# Patient Record
Sex: Female | Born: 1960 | Race: White | Hispanic: No | Marital: Married | Smoking: Former smoker
Health system: Southern US, Community
[De-identification: ages and names within clinical notes are randomized; demographics above are authoritative.]

## PROBLEM LIST (undated history)

## (undated) DIAGNOSIS — M1990 Unspecified osteoarthritis, unspecified site: Secondary | ICD-10-CM

## (undated) HISTORY — PX: KIDNEY SURGERY: SHX687

---

## 2010-02-05 ENCOUNTER — Ambulatory Visit: Payer: Self-pay | Admitting: Internal Medicine

## 2011-10-20 ENCOUNTER — Ambulatory Visit: Payer: Self-pay

## 2012-02-09 ENCOUNTER — Ambulatory Visit: Payer: Self-pay

## 2016-01-15 ENCOUNTER — Encounter: Payer: Self-pay | Admitting: *Deleted

## 2016-01-15 ENCOUNTER — Ambulatory Visit (INDEPENDENT_AMBULATORY_CARE_PROVIDER_SITE_OTHER): Payer: BLUE CROSS/BLUE SHIELD

## 2016-01-15 ENCOUNTER — Ambulatory Visit
Admission: EM | Admit: 2016-01-15 | Discharge: 2016-01-15 | Disposition: A | Discharge status: Home / Self Care | Payer: BLUE CROSS/BLUE SHIELD | Attending: Family Medicine | Admitting: Family Medicine

## 2016-01-15 DIAGNOSIS — S5011XA Contusion of right forearm, initial encounter: Secondary | ICD-10-CM | POA: Diagnosis not present

## 2016-01-15 DIAGNOSIS — S20211A Contusion of right front wall of thorax, initial encounter: Secondary | ICD-10-CM

## 2016-01-15 DIAGNOSIS — S161XXA Strain of muscle, fascia and tendon at neck level, initial encounter: Secondary | ICD-10-CM | POA: Diagnosis not present

## 2016-01-15 DIAGNOSIS — R072 Precordial pain: Secondary | ICD-10-CM | POA: Diagnosis present

## 2016-01-15 DIAGNOSIS — Z87891 Personal history of nicotine dependence: Secondary | ICD-10-CM | POA: Insufficient documentation

## 2016-01-15 DIAGNOSIS — S50811A Abrasion of right forearm, initial encounter: Secondary | ICD-10-CM

## 2016-01-15 DIAGNOSIS — Z880 Allergy status to penicillin: Secondary | ICD-10-CM | POA: Diagnosis not present

## 2016-01-15 DIAGNOSIS — S29012A Strain of muscle and tendon of back wall of thorax, initial encounter: Secondary | ICD-10-CM | POA: Diagnosis not present

## 2016-01-15 HISTORY — DX: Unspecified osteoarthritis, unspecified site: M19.90

## 2016-01-15 MED ORDER — ACETAMINOPHEN 500 MG PO TABS: 1000.0000 mg | ORAL_TABLET | Freq: Four times a day (QID) | ORAL | Status: AC | PRN

## 2016-01-15 MED ORDER — DICLOFENAC SODIUM 75 MG PO TBEC: 75.0000 mg | DELAYED_RELEASE_TABLET | Freq: Two times a day (BID) | ORAL | Status: AC

## 2016-01-15 NOTE — Discharge Instructions (Signed)
Abrasion An abrasion is a cut or scrape on the outer surface of your skin. An abrasion does not extend through all of the layers of your skin. It is important to care for your abrasion properly to prevent infection. CAUSES Most abrasions are caused by falling on or gliding across the ground or another surface. When your skin rubs on something, the outer and inner layer of skin rubs off.  SYMPTOMS A cut or scrape is the main symptom of this condition. The scrape may be bleeding, or it may appear red or pink. If there was an associated fall, there may be an underlying bruise. DIAGNOSIS An abrasion is diagnosed with a physical exam. TREATMENT Treatment for this condition depends on how large and deep the abrasion is. Usually, your abrasion will be cleaned with water and mild soap. This removes any dirt or debris that may be stuck. An antibiotic ointment may be applied to the abrasion to help prevent infection. A bandage (dressing) may be placed on the abrasion to keep it clean. You may also need a tetanus shot. HOME CARE INSTRUCTIONS Medicines  Take or apply medicines only as directed by your health care provider.  If you were prescribed an antibiotic ointment, finish all of it even if you start to feel better. Wound Care  Clean the wound with mild soap and water 2-3 times per day or as directed by your health care provider. Pat your wound dry with a clean towel. Do not rub it.  There are many different ways to close and cover a wound. Follow instructions from your health care provider about:  Wound care.  Dressing changes and removal.  Check your wound every day for signs of infection. Watch for:  Redness, swelling, or pain.  Fluid, blood, or pus. General Instructions  Keep the dressing dry as directed by your health care provider. Do not take baths, swim, use a hot tub, or do anything that would put your wound underwater until your health care provider approves.  If there is  swelling, raise (elevate) the injured area above the level of your heart while you are sitting or lying down.  Keep all follow-up visits as directed by your health care provider. This is important. SEEK MEDICAL CARE IF:  You received a tetanus shot and you have swelling, severe pain, redness, or bleeding at the injection site.  Your pain is not controlled with medicine.  You have increased redness, swelling, or pain at the site of your wound. SEEK IMMEDIATE MEDICAL CARE IF:  You have a red streak going away from your wound.  You have a fever.  You have fluid, blood, or pus coming from your wound.  You notice a bad smell coming from your wound or your dressing.   This information is not intended to replace advice given to you by your health care provider. Make sure you discuss any questions you have with your health care provider.   Document Released: 09/12/2005 Document Revised: 08/24/2015 Document Reviewed: 12/01/2014 Elsevier Interactive Patient Education 2016 [place].  Chest Contusion A chest contusion is a deep bruise on your chest area. Contusions are the result of an injury that caused bleeding under the skin. A chest contusion may involve bruising of the skin, muscles, or ribs. The contusion may turn blue, purple, or yellow. Minor injuries will give you a painless contusion, but more severe contusions may stay painful and swollen for a few weeks. CAUSES  A contusion is usually caused by a blow,  trauma, or direct force to an area of the body. SYMPTOMS   Swelling and redness of the injured area.  Discoloration of the injured area.  Tenderness and soreness of the injured area.  Pain. DIAGNOSIS  The diagnosis can be made by taking a history and performing a physical exam. An X-ray, CT scan, or MRI may be needed to determine if there were any associated injuries, such as broken bones (fractures) or internal injuries. TREATMENT  Often, the best treatment for a chest  contusion is resting, icing, and applying cold compresses to the injured area. Deep breathing exercises may be recommended to reduce the risk of pneumonia. Over-the-counter medicines may also be recommended for pain control. HOME CARE INSTRUCTIONS   Put ice on the injured area.  Put ice in a plastic bag.  Place a towel between your skin and the bag.  Leave the ice on for 15-20 minutes, 03-04 times a day.  Only take over-the-counter or prescription medicines as directed by your caregiver. Your caregiver may recommend avoiding anti-inflammatory medicines (aspirin, ibuprofen, and naproxen) for 48 hours because these medicines may increase bruising.  Rest the injured area.  Perform deep-breathing exercises as directed by your caregiver.  Stop smoking if you smoke.  Do not lift objects over 5 pounds (2.3 kg) for 3 days or longer if recommended by your caregiver. SEEK IMMEDIATE MEDICAL CARE IF:   You have increased bruising or swelling.  You have pain that is getting worse.  You have difficulty breathing.  You have dizziness, weakness, or fainting.  You have blood in your urine or stool.  You cough up or vomit blood.  Your swelling or pain is not relieved with medicines. MAKE SURE YOU:   Understand these instructions.  Will watch your condition.  Will get help right away if you are not doing well or get worse.   This information is not intended to replace advice given to you by your health care provider. Make sure you discuss any questions you have with your health care provider.   Document Released: 08/28/2001 Document Revised: 08/27/2012 Document Reviewed: 05/26/2012 Elsevier Interactive Patient Education 2016 [employer] Mid-Back Strain With Rehab  A strain is an injury in which a tendon or muscle is torn. The muscles and tendons of the mid-back are vulnerable to strains. However, these muscles and tendons are very strong and require a great force to be injured. The  muscles of the mid-back are responsible for stabilizing the spinal column, as well as spinal twisting (rotation). Strains are classified into three categories. Grade 1 strains cause pain, but the tendon is not lengthened. Grade 2 strains include a lengthened ligament, due to the ligament being stretched or partially ruptured. With grade 2 strains there is still function, although the function may be decreased. Grade 3 strains involve a complete tear of the tendon or muscle, and function is usually impaired. SYMPTOMS   Pain in the middle of the back.  Pain that may affect only one side, and is worse with movement.  Muscle spasms, and often swelling in the back.  Loss of strength of the back muscles.  Crackling sound (crepitation) when the muscles are touched. CAUSES  Mid-back strains occur when a force is placed on the muscles or tendons that is greater than they can handle. Common causes of injury include:  Ongoing overuse of the muscle-tendon units in the middle back, usually from incorrect body posture.  A single violent injury or force applied to the back. RISK  INCREASES WITH:  Sports that involve twisting forces on the spine or a lot of bending at the waist (football, rugby, weightlifting, bowling, golf, tennis, speed skating, racquetball, swimming, running, gymnastics, diving).  Poor strength and flexibility.  Failure to warm up properly before activity.  Family history of low back pain or disk disorders.  Previous back injury or surgery (especially fusion). PREVENTION  Learn and use proper sports technique.  Warm up and stretch properly before activity.  Allow for adequate recovery between workouts.  Maintain physical fitness:  Strength, flexibility, and endurance.  Cardiovascular fitness. PROGNOSIS  If treated properly, mid-back strains usually heal within 6 weeks. RELATED COMPLICATIONS   Frequently recurring symptoms, resulting in a chronic problem. Properly  treating the problem the first time decreases frequency of recurrence.  Chronic inflammation, scarring, and partial muscle-tendon tear.  Delayed healing or resolution of symptoms, especially if activity is resumed too soon.  Prolonged disability. TREATMENT Treatment first involves the use of ice and medicine, to reduce pain and inflammation. As the pain begins to subside, you may begin strengthening and stretching exercises to improve body posture and sport technique. These exercises may be performed at home or with a therapist. Severe injuries may require referral to a therapist for further evaluation and treatment, such as ultrasound. Corticosteroid injections may be given to help reduce inflammation. Biofeedback (watching monitors of your body processes) and psychotherapy may also be prescribed. Prolonged bed rest is felt to do more harm than good. Massage may help break the muscle spasms. Sometimes, an injection of cortisone, with or without local anesthetics, may be given to help relieve the pain and spasms. MEDICATION   If pain medicine is needed, nonsteroidal anti-inflammatory medicines (aspirin and ibuprofen), or other minor pain relievers (acetaminophen), are often advised.  Do not take pain medicine for 7 days before surgery.  Prescription pain relievers may be given, if your caregiver thinks they are needed. Use only as directed and only as much as you need.  Ointments applied to the skin may be helpful.  Corticosteroid injections may be given by your caregiver. These injections should be reserved for the most serious cases, because they may only be given a certain number of times. HEAT AND COLD:   Cold treatment (icing) should be applied for 10 to 15 minutes every 2 to 3 hours for inflammation and pain, and immediately after activity that aggravates your symptoms. Use ice packs or an ice massage.  Heat treatment may be used before performing stretching and strengthening activities  prescribed by your caregiver, physical therapist, or athletic trainer. Use a heat pack or a warm water soak. SEEK IMMEDIATE MEDICAL CARE IF:  Symptoms get worse or do not improve in 2 to 4 weeks, despite treatment.  You develop numbness, weakness, or loss of bowel or bladder function.  New, unexplained symptoms develop. (Drugs used in treatment may produce side effects.) EXERCISES RANGE OF MOTION (ROM) AND STRETCHING EXERCISES - Mid-Back Strain These exercises may help you when beginning to rehabilitate your injury. In order to successfully resolve your symptoms, you must improve your posture. These exercises are designed to help reduce the forward-head and rounded-shoulder posture which contributes to this condition. Your symptoms may resolve with or without further involvement from your physician, physical therapist or athletic trainer. While completing these exercises, remember:   Restoring tissue flexibility helps normal motion to return to the joints. This allows healthier, less painful movement and activity.  An effective stretch should be held for at least 30  seconds.  A stretch should never be painful. You should only feel a gentle lengthening or release in the stretched tissue. STRETCH - Axial Extension  Stand or sit on a firm surface. Assume a good posture: chest up, shoulders drawn back, stomach muscles slightly tense, knees unlocked (if standing) and feet hip width apart.  Slowly retract your chin, so your head slides back and your chin slightly lowers. Continue to look straight ahead.  You should feel a gentle stretch in the back of your head. Be certain not to feel an aggressive stretch since this can cause headaches later.  Hold for __________ seconds. Repeat __________ times. Complete this exercise __________ times per day. RANGE OF MOTION- Upper Thoracic Extension  Sit on a firm chair with a high back. Assume a good posture: chest up, shoulders drawn back, abdominal  muscles slightly tense, and feet hip width apart. Place a small pillow or folded towel in the curve of your lower back, if you are having difficulty maintaining good posture.  Gently brace your neck with your hands, allowing your arms to rest on your chest.  Continue to support your neck and slowly extend your back over the chair. You will feel a stretch across your upper back.  Hold __________ seconds. Slowly return to the starting position. Repeat __________ times. Complete this exercise __________ times per day. RANGE OF MOTION- Mid-Thoracic Extension  Roll a towel so that it is about 4 inches in diameter.  Position the towel lengthwise. Lay on the towel so that your spine, but not your shoulder blades, are supported.  You should feel your mid-back arching toward the floor. To increase the stretch, extend your arms away from your body.  Hold for __________ seconds. Repeat exercise __________ times, __________ times per day. STRENGTHENING EXERCISES - Mid-Back Strain These exercises may help you when beginning to rehabilitate your injury. They may resolve your symptoms with or without further involvement from your physician, physical therapist or athletic trainer. While completing these exercises, remember:   Muscles can gain both the endurance and the strength needed for everyday activities through controlled exercises.  Complete these exercises as instructed by your physician, physical therapist or athletic trainer. Increase the resistance and repetitions only as guided by your caregiver.  You may experience muscle soreness or fatigue, but the pain or discomfort you are trying to eliminate should never worsen during these exercises. If this pain does worsen, stop and make certain you are following the directions exactly. If the pain is still present after adjustments, discontinue the exercise until you can discuss the trouble with your caregiver. STRENGTHENING - Quadruped, Opposite UE/LE  Lift  Assume a hands and knees position on a firm surface. Keep your hands under your shoulders and your knees under your hips. You may place padding under your knees for comfort.  Find your neutral spine and gently tense your abdominal muscles so that you can maintain this position. Your shoulders and hips should form a rectangle that is parallel with the floor and is not twisted.  Keeping your trunk steady, lift your right hand no higher than your shoulder and then your left leg no higher than your hip. Make sure you are not holding your breath. Hold this position __________ seconds.  Continuing to keep your abdominal muscles tense and your back steady, slowly return to your starting position. Repeat with the opposite arm and leg. Repeat __________ times. Complete this exercise __________ times per day.  STRENGTH - Shoulder Extensors  Secure  a rubber exercise band or tubing to a fixed object (table, pole) so that it is at the height of your shoulders when you are either standing, or sitting on a firm armless chair.  With a thumbs-up grip, grasp an end of the band in each hand. Straighten your elbows and lift your hands straight in front of you at shoulder height. Step back away from the secured end of band, until it becomes tense.  Squeezing your shoulder blades together, pull your hands down to the sides of your thighs. Do not allow your hands to go behind you.  Hold for __________ seconds. Slowly ease the tension on the band, as you reverse the directions and return to the starting position. Repeat __________ times. Complete this exercise __________ times per day.  STRENGTH - Horizontal Abductors Choose one of the two positions to complete this exercise. Prone: lying on stomach:  Lie on your stomach on a firm surface so that your right / left arm overhangs the edge. Rest your forehead on your opposite forearm. With your palm facing the floor and your elbow straight, hold a __________  weight in your hand.  Squeeze your right / left shoulder blade to your mid-back spine and then slowly raise your arm to the height of the bed.  Hold for __________ seconds. Slowly reverse the directions and return to the starting position, controlling the weight as you lower your arm. Repeat __________ times. Complete this exercise __________ times per day. Standing:   Secure a rubber exercise band or tubing, so that it is at the height of your shoulders when you are either standing, or sitting on a firm armless chair.  Grasp an end of the band in each hand and have your palms face each other. Straighten your elbows and lift your hands straight in front of you at shoulder height. Step back away from the secured end of band, until it becomes tense.  Squeeze your shoulder blades together. Keeping your elbows locked and your hands at shoulder height, spread your arms apart, forming a "T" shape with your body. Hold __________ seconds. Slowly ease the tension on the band, as you reverse the directions and return to the starting position. Repeat __________ times. Complete this exercise __________ times per day. STRENGTH - Scapular Retractors and External Rotators, Rowing  Secure a rubber exercise band or tubing, so that it is at the height of your shoulders when you are either standing, or sitting on a firm armless chair.  With a palm-down grip, grasp an end of the band in each hand. Straighten your elbows and lift your hands straight in front of you at shoulder height. Step back away from the secured end of band, until it becomes tense.  Step 1: Squeeze your shoulder blades together. Bending your elbows, draw your hands to your chest as if you are rowing a boat. At the end of this motion, your hands and elbow should be at shoulder height and your elbows should be out to your sides.  Step 2: Rotate your shoulder to raise your hands above your head. Your forearms should be vertical and your upper arms  should be horizontal.  Hold for __________ seconds. Slowly ease the tension on the band, as you reverse the directions and return to the starting position. Repeat __________ times. Complete this exercise __________ times per day.  POSTURE AND BODY MECHANICS CONSIDERATIONS - Mid-Back Strain Keeping correct posture when sitting, standing or completing your activities will reduce the stress put  on different body tissues, allowing injured tissues a chance to heal and limiting painful experiences. The following are general guidelines for improved posture. Your physician or physical therapist will provide you with any instructions specific to your needs. While reading these guidelines, remember:  The exercises prescribed by your provider will help you have the flexibility and strength to maintain correct postures.  The correct posture provides the best environment for your joints to work. All of your joints have less wear and tear when properly supported by a spine with good posture. This means you will experience a healthier, less painful body.  Correct posture must be practiced with all of your activities, especially prolonged sitting and standing. Correct posture is as important when doing repetitive low-stress activities (typing) as it is when doing a single heavy-load activity (lifting). PROPER SITTING POSTURE In order to minimize stress and discomfort on your spine, you must sit with correct posture. Sitting with good posture should be effortless for a healthy body. Returning to good posture is a gradual process. Many people can work toward this most comfortably by using various supports until they have the flexibility and strength to maintain this posture on their own. When sitting with proper posture, your ears will fall over your shoulders and your shoulders will fall over your hips. You should use the back of the chair to support your upper back. Your lower back will be in a neutral position, just  slightly arched. You may place a small pillow or folded towel at the base of your low back for  support.  When working at a desk, create an environment that supports good, upright posture. Without extra support, muscles fatigue and lead to excessive strain on joints and other tissues. Keep these recommendations in mind: CHAIR:  A chair should be able to slide under your desk when your back makes contact with the back of the chair. This allows you to work closely.  The chair's height should allow your eyes to be level with the upper part of your monitor and your hands to be slightly lower than your elbows. BODY POSITION  Your feet should make contact with the floor. If this is not possible, use a foot rest.  Keep your ears over your shoulders. This will reduce stress on your neck and lower back. INCORRECT SITTING POSTURES If you are feeling tired and unable to assume a healthy sitting posture, do not slouch or slump. This puts excessive strain on your back tissues, causing more damage and pain. Healthier options include:  Using more support, like a lumbar pillow.  Switching tasks to something that requires you to be upright or walking.  Talking a brief walk.  Lying down to rest in a neutral-spine position. CORRECT STANDING POSTURES Proper standing posture should be assumed with all daily activities, even if they only take a few moments, like when brushing your teeth. As in sitting, your ears should fall over your shoulders and your shoulders should fall over your hips. You should keep a slight tension in your abdominal muscles to brace your spine. Your tailbone should point down to the ground, not behind your body, resulting in an over-extended swayback posture.  INCORRECT STANDING POSTURES Common incorrect standing postures include a forward head, locked knees, and an excessive swayback. WALKING Walk with an upright posture. Your ears, shoulders and hips should all line-up. CORRECT  LIFTING TECHNIQUES DO :   Assume a wide stance. This will provide you more stability and the opportunity to get  as close as possible to the object which you are lifting.  Tense your abdominals to brace your spine. Bend at the knees and hips. Keeping your back locked in a neutral-spine position, lift using your leg muscles. Lift with your legs, keeping your back straight.  Test the weight of unknown objects before attempting to lift them.  Try to keep your elbows locked down at your sides in order get the best strength from your shoulders when carrying an object.  Always ask for help when lifting heavy or awkward objects. INCORRECT LIFTING TECHNIQUES DO NOT:   Lock your knees when lifting, even if it is a small object.  Bend and twist. Pivot at your feet or move your feet when needing to change directions.  Assume that you can safely pick up even a paperclip without proper posture.   This information is not intended to replace advice given to you by your health care provider. Make sure you discuss any questions you have with your health care provider.   Document Released: 12/03/2005 Document Revised: 04/19/2015 Document Reviewed: 03/17/2009 Else[employer] Interactive Patient Education [employer]

## 2016-01-15 NOTE — ED Notes (Signed)
Pt states that she fell off her horse 2 weeks ago, landed on her right side, "crushing" her right shoulder, rib, chest, back area, Pt states that she is still having pain.

## 2016-01-15 NOTE — ED Provider Notes (Signed)
CSN: [id number]     Arrival date & time 01/15/16  1444 History   First MD Initiated Contact with Patient 01/15/16 1529     Chief Complaint  Patient presents with  . 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  Fell off horse trotting (saddle fell off) 2 weeks ago landed on right arm was wearing jacket/sweater thick clothes right elbow with bruising and scratch along right forearm that has resolved.  Arm pain initially gradually improving but central chest pain initially all the time then changed to burning with cough, sneeze, breathing has worsened as noted above.  "I am worried I hurt my heart or lungs"  Patient reported she had cold symptoms around the same time chest symptoms changed also. Requesting EKG and imaging as has to fly again tomorrow.  Tried running today didn't go well.  Started taking her rheumatoid arthritis Voltaren  po BID helping with pain but not resolving it she takes prn for rheumatoid arthritis typically but started after fall off horse.  Having right scapular/mid back pain also with certain movements/deep inspiration.  Right hand dominant  FHx F- MI M-hyperlipidemia  Patient is a 55 y.o. female presenting with fall. The history is provided by the patient.  Fall This is a new problem. The current episode started more than 1 week ago. The problem occurs rarely. The problem has been gradually improving. Associated symptoms include chest pain. Pertinent negatives include no abdominal pain, no headaches and no shortness of breath. The symptoms are aggravated by walking, bending, twisting, exertion and coughing. The symptoms are relieved by medications and rest. She has tried rest, food and water for the symptoms. The treatment provided moderate relief.    Past Medical History  Diagnosis  Date  . Arthritis    Past Surgical History  Procedure Laterality Date  . Cesarean section    . Kidney surgery     History reviewed. No pertinent family history. Social History  Substance Use Topics  . Smoking status: Former Games developer  . Smokeless tobacco: None  . Alcohol Use: Yes   OB History    No data available     Review of Systems  Constitutional: Negative for fever, chills, diaphoresis, activity change, appetite change and fatigue.  HENT: Positive for congestion and rhinorrhea. Negative for dental problem, drooling, ear discharge, ear pain, facial swelling, hearing loss, mouth sores, nosebleeds, sore throat, trouble swallowing and voice change.   Eyes: Negative for photophobia, pain, discharge, redness, itching and visual disturbance.  Respiratory: Positive for cough. Negative for apnea, choking, chest tightness, shortness of breath, wheezing and stridor.   Cardiovascular: Positive for chest pain. Negative for palpitations and leg swelling.  Gastrointestinal: Negative for nausea, vomiting, abdominal pain, diarrhea, constipation, blood in stool, abdominal distention and anal bleeding.  Endocrine: Negative for cold intolerance and heat intolerance.  Genitourinary: Negative for dysuria, hematuria and difficulty urinating.  Musculoskeletal: Positive for myalgias. Negative for back pain, joint swelling, arthralgias, gait problem, neck pain and neck stiffness.  Skin: Positive for rash and wound. Negative for color change and pallor.  Allergic/Immunologic: Negative for environmental allergies and food allergies.  Neurological: Negative for dizziness, tremors, seizures, syncope, facial asymmetry, speech difficulty, weakness, light-headedness, numbness and headaches.  Hematological: Negative for adenopathy. Does not bruise/bleed easily.  Psychiatric/Behavioral: Negative for confusion, sleep disturbance and agitation. The patient is not nervous/anxious.     Allergies  Ciprofloxacin;  Contrast media; Demerol; Penicillins; Shellfish allergy; and Sulfa antibiotics  Home Medications   Prior to Admission medications   Medication Sig Start Date End Date Taking? Authorizing Provider  diclofenac (VOLTAREN) 75 MG EC tablet Take 75 mg by mouth 2 (two) times daily.   Yes Historical Provider, MD  nitrofurantoin, macrocrystal-monohydrate, (MACROBID) 100 MG capsule Take 100 mg by mouth 2 (two) times daily.   Yes Historical Provider, MD  acetaminophen (TYLENOL) 500 MG tablet Take 2 tablets (1,000 mg total) by mouth every 6 (six) hours as needed for mild pain or moderate pain. 01/15/16 01/27/16  Barbaraann Barthel, NP  diclofenac (VOLTAREN) 75 MG EC tablet Take 1 tablet (75 mg total) by mouth 2 (two) times daily. 01/15/16 02/14/16  Barbaraann Barthel, NP   Meds Ordered and Administered this Visit  Medications - No data to display  BP 127/89 mmHg  Pulse 80  Temp(Src) 98.1 F (36.7 C) (Oral)  Ht 5' 11.5" (1.816 m)  Wt 175 lb (79.379 kg)  BMI 24.07 kg/m2  SpO2 100% No data found.   Physical Exam  Constitutional: She is oriented to person, place, and time. She appears well-developed and well-nourished. She is active and cooperative.  Non-toxic appearance. She does not have a sickly appearance. She does not appear ill. No distress.  HENT:  Head: Normocephalic and atraumatic.  Right Ear: Hearing, external ear and ear canal normal. A middle ear effusion is present.  Left Ear: Hearing, external ear and ear canal normal. A middle ear effusion is present.  Nose: Mucosal edema present. No rhinorrhea, nose lacerations, sinus tenderness, nasal deformity, septal deviation or nasal septal hematoma. No epistaxis.  No foreign bodies. Right sinus exhibits no maxillary sinus tenderness and no frontal sinus tenderness. Left sinus exhibits no maxillary sinus tenderness and no frontal sinus tenderness.  Mouth/Throat: Uvula is midline and mucous membranes are normal. Mucous membranes are not pale, not dry  and not cyanotic. She does not have dentures. No oral lesions. No trismus in the jaw. Normal dentition. No dental abscesses, uvula swelling, lacerations or dental caries. Posterior oropharyngeal edema and posterior oropharyngeal erythema present. No oropharyngeal exudate or tonsillar abscesses.  Cobblestoning posterior pharynx; bilateral TMs with air fluid level clear  Eyes: Conjunctivae, EOM and lids are normal. Pupils are equal, round, and reactive to light. Right eye exhibits no chemosis, no discharge, no exudate and no hordeolum. No foreign body present in the right eye. Left eye exhibits no chemosis, no discharge, no exudate and no hordeolum. No foreign body present in the left eye. Right conjunctiva is not injected. Right conjunctiva has no hemorrhage. Left conjunctiva is not injected. Left conjunctiva has no hemorrhage. No scleral icterus. Right eye exhibits normal extraocular motion and no nystagmus. Left eye exhibits normal extraocular motion and no nystagmus. Right pupil is round and reactive. Left pupil is round and reactive. Pupils are equal.  Neck: Trachea normal and normal range of motion. Neck supple. No tracheal tenderness, no spinous process tenderness and no muscular tenderness present. No rigidity. No tracheal deviation, no edema, no erythema and normal range of motion present. No thyroid mass and no thyromegaly present.  Cardiovascular: Regular rhythm, S1 normal, S2 normal, normal heart sounds and intact distal pulses.  Bradycardia present.  PMI is not displaced.  Exam reveals no gallop and no friction rub.   No murmur heard.   Pulmonary/Chest: Effort normal and breath sounds normal. No accessory muscle usage or stridor. No respiratory distress. She has no decreased  breath sounds. She has no wheezes. She has no rhonchi. She has no rales. She exhibits no tenderness.  Abdominal: Soft. She exhibits no distension.  Musculoskeletal: Normal range of motion.       Right shoulder: She exhibits  pain.       Left shoulder: Normal.       Right elbow: Normal.      Left elbow: Normal.       Right wrist: Normal.       Left wrist: Normal.       Right hip: Normal.       Left hip: Normal.       Right knee: Normal.       Left knee: Normal.       Right ankle: Normal.       Left ankle: Normal.       Cervical back: Normal.       Thoracic back: She exhibits tenderness and pain. She exhibits normal range of motion, no bony tenderness, no swelling, no edema, no deformity, no laceration, no spasm and normal pulse.       Lumbar back: She exhibits pain. She exhibits normal range of motion, no tenderness, no bony tenderness, no swelling, no edema, no deformity, no laceration, no spasm and normal pulse.       Back:       Right upper arm: Normal.       Left upper arm: Normal.       Right forearm: Normal.       Left forearm: Normal.       Arms:      Right hand: Normal.       Left hand: Normal.  Pain with empty can but scapular only right; external rotation right shoulder pain right scapular ridge; crossbody reach pain right scapula - atchley scratch/internal rotation; patient sitting in chair working on laptop wearing exercise attire easily moves from chair to exam table to perform AROM exercises for PE no grimacing or holding breath  Lymphadenopathy:       Head (right side): No submental, no submandibular, no tonsillar, no preauricular, no posterior auricular and no occipital adenopathy present.       Head (left side): No submental, no submandibular, no tonsillar, no preauricular, no posterior auricular and no occipital adenopathy present.    She has no cervical adenopathy.       Right cervical: No superficial cervical, no deep cervical and no posterior cervical adenopathy present.      Left cervical: No superficial cervical, no deep cervical and no posterior cervical adenopathy present.  Neurological: She is alert and oriented to person, place, and time. She has normal strength. She is not  disoriented. She displays no atrophy and no tremor. No cranial nerve deficit or sensory deficit. She exhibits normal muscle tone. She displays no seizure activity. Coordination and gait normal. GCS eye subscore is 4. GCS verbal subscore is 5. GCS motor subscore is 6.  Reflex Scores:      Patellar reflexes are 2+ on the right side and 2+ on the left side. Bilateral hand grasp equal arom 5/5;   Skin: Skin is warm, dry and intact. No abrasion, no bruising, no burn, no ecchymosis, no laceration, no lesion, no petechiae and no rash noted. She is not diaphoretic. No cyanosis or erythema. No pallor. Nails show no clubbing.  Psychiatric: She has a normal mood and affect. Her speech is normal and behavior is normal. Judgment and thought content normal. Cognition and memory  are normal.  Nursing note and vitals reviewed.   ED Course  ED EKG  Date/Time: 01/15/2016 4:16 PM Performed by: Albina Billet A Authorized by: Albina Billet A Comparison: not compared with previous ECG  Previous ECG: no previous ECG available Rhythm: sinus bradycardia Rate: bradycardic BPM: 55 QRS axis: normal Conduction: conduction normal ST Segments: ST segments normal T Waves: T waves normal Other: no other findings Clinical impression: normal ECG Comments: Vent rate 55bpm PR interval QRS duration 86ms QT/QTc 450/455ms PRT axes 43 44 49   (including critical care time)  Labs Review Labs Reviewed - No data to display  Imaging Review Dg Chest 2 View  01/15/2016  CLINICAL DATA:  Larey Seat off porch 2 weeks ago.  Midsternal chest pain. EXAM: CHEST  2 VIEW COMPARISON:  02/09/2012 FINDINGS: The heart size and mediastinal contours are within normal limits. Both lungs are clear. No evidence of pneumothorax or hemothorax. The visualized skeletal structures are unremarkable. IMPRESSION: No active cardiopulmonary disease. Electronically Signed   By: Myles Rosenthal M.D.   On: 01/15/2016 16:30     1615 Reviewed [place]  Controlled Substances Website Last Name:  [facility]:  First Name:  Ortencia  Zip Code:  Date of Birth:  09/23/1961  Dispensed Start Date:  12/17/2014 Dispensed End Date:  01/15/2016  Recipients:         Dispensed From 12/17/2014 to 01/15/2016 4 out of 4 Recipient(s) Selected [place], Mackynzie - DOB: [birth year] - [address] [place], [place] - DOB: [birth year] - [address] [place], Milisa - DOB: Sep 08, 1961 - [address] [place], [place] - DOB: 15-Jul-1961 - [address] " Date Dispensed/ Date Prescribed Drug Name/ NDC Qty. Dispensed/ Days Supply Refill #/ Authorized Refills Gaffer Recipient *Pmt. Method MED Daily  07/07/2015 07/07/2015 CLONAZEPAM 0.5 MG TABLET [id number] 45 45 0 0 [id number] SOTOLONGO CARLOS A MD [place], [place] [id number] [facility] [place], [place] Despain, Lynee 1961-09-03 [address] [place], [place] [address] 04 0  01/31/2015 01/31/2015 OXYCODONE- ACETAMINOPHEN 5- 325 [id number] 12 3 0 0 [id number] LEVIN GIBSON LINDA DDS [place], [place] [id number] [facility] [place], [place] Wendt, Priscillia 1961/06/28 [address] [place], [place] [address] 04 30  01/04/2015 01/04/2015 CLONAZEPAM 0.5 MG TABLET [id number] 45 45 0 0 [id number] HAYWARD MARGARET K (MSN FNP-BC) [place], [place] [id number] [facility] [place], [place] Trani, [place] 02-22-1961 [address] [place], [place] [address] 04 0   1620 EKG sinus bradycardia patient notified normal no acute MI noted  Given copy of ekg report. Chest xray pending patient verbalized understanding of information and had no further questions at this time.  1650 discussed normal chest xray results with patient probable contusion/muscle strain intercostal. Given copy of radiology report. Continue voltaren and or tylenol.  Follow up with PCM 2 weeks if not gradually improving sooner if worsening chest pain, sob, wheezing.  Patient verbalized understanding of  information/instructions, agreed with plan of care and had no further questions at this time.   MDM   1. Fall from horse, initial encounter   2. Chest wall contusion, right, initial encounter   3. Forearm contusion, right, initial encounter   4. Abrasion of forearm, right, initial encounter   5. Strain of mid-back, initial encounter    Patient was instructed to rest, ice, and ROM exercises. May continue voltaren 75mg  po BID refilled 30 day supply or tylenol 1000mg  po QID prn pain.  Ice or heat prn.  Exitcare  handout on mid back strain with rehab exercises, contusion, muscle strain given to patient.  Activity as tolerated start with low impact no weights and work back up to running.  If not improving consider MRI/follow up with PCM/orthopedics of choice/formal PT.  Patient verbalized agreement and understanding of treatment plan and had no further questions at this time.  P2:  Injury Prevention and Fitness.  For acute pain, rest, and intermittent application of heat (do not sleep on heating pad).  I discussed longer term treatment plan of PRN NSAIDS and I discussed a home back care exercise program with a flexion exercise routine.  Proper avoidance of heavy lifting discussed.  Consider physical therapy and additional radiology (MRI) if not improving.  Call [facility] or return to clinic as needed if these symptoms worsen or fail to improve as anticipated.   Patient verbalized agreement and understanding of treatment plan and had no further questions at this time. P2:  Injury Prevention, fitness  Patient was instructed to rest, and ice right forearm/ribs.  Do not soak arm until abrasions completely healed avoid pool, lake, hot tub, dirty sink water.  Exitcare handout on contusion and abrasion given to patient.   Medications as directed.  Call or follow up with PCM/orthopedics of choice as needed if these symptoms worsen or fail to improve as anticipated consider MRI/formal PT/orthopedics evaluation.  Patient  verbalized agreement and understanding of treatment plan and had no further questions at this time.  P2:  ROM, injury prevention   Barbaraann Barthel, NP 01/15/16 1704  Barbaraann Barthel, NP 01/15/16 1706

## 2016-03-25 ENCOUNTER — Encounter: Payer: Self-pay | Admitting: Gynecology

## 2016-03-25 ENCOUNTER — Ambulatory Visit
Admission: EM | Admit: 2016-03-25 | Discharge: 2016-03-25 | Disposition: A | Discharge status: Home / Self Care | Payer: BLUE CROSS/BLUE SHIELD | Attending: Family Medicine | Admitting: Family Medicine

## 2016-03-25 DIAGNOSIS — B349 Viral infection, unspecified: Secondary | ICD-10-CM | POA: Diagnosis not present

## 2016-03-25 DIAGNOSIS — J988 Other specified respiratory disorders: Secondary | ICD-10-CM

## 2016-03-25 DIAGNOSIS — B9789 Other viral agents as the cause of diseases classified elsewhere: Secondary | ICD-10-CM

## 2016-03-25 LAB — RAPID STREP SCREEN (MED CTR MEBANE ONLY): Streptococcus, Group A Screen (Direct): NEGATIVE

## 2016-03-25 LAB — RAPID INFLUENZA A&B ANTIGENS
Influenza A (ARMC): NEGATIVE
Influenza B (ARMC): NEGATIVE

## 2016-03-25 MED ORDER — ALBUTEROL SULFATE HFA 108 (90 BASE) MCG/ACT IN AERS: 2.0000 | INHALATION_SPRAY | RESPIRATORY_TRACT | Status: AC | PRN

## 2016-03-25 MED ORDER — BENZONATATE 100 MG PO CAPS: 100.0000 mg | ORAL_CAPSULE | Freq: Three times a day (TID) | ORAL | Status: DC | PRN

## 2016-03-25 MED ORDER — GUAIFENESIN-CODEINE 100-10 MG/5ML PO SOLN: 10.0000 mL | Freq: Every evening | ORAL | Status: DC | PRN

## 2016-03-25 NOTE — Discharge Instructions (Signed)
Take medication as prescribed. Rest. Drink plenty of fluids. Take over the counter tylenol or ibuprofen as needed for pain or fever.  ° °Follow up with your primary care physician this week as needed. Return to Urgent care for new or worsening concerns.  ° °Viral Infections °A viral infection can be caused by different types of viruses. Most viral infections are not serious and resolve on their own. However, some infections may cause severe symptoms and may lead to further complications. °SYMPTOMS °Viruses can frequently cause: °· Minor sore throat. °· Aches and pains. °· Headaches. °· Runny nose. °· Different types of rashes. °· Watery eyes. °· Tiredness. °· Cough. °· Loss of appetite. °· Gastrointestinal infections, resulting in nausea, vomiting, and diarrhea. °These symptoms do not respond to antibiotics because the infection is not caused by bacteria. However, you might catch a bacterial infection following the viral infection. This is sometimes called a "superinfection." Symptoms of such a bacterial infection may include: °· Worsening sore throat with pus and difficulty swallowing. °· Swollen neck glands. °· Chills and a high or persistent fever. °· Severe headache. °· Tenderness over the sinuses. °· Persistent overall ill feeling (malaise), muscle aches, and tiredness (fatigue). °· Persistent cough. °· Yellow, green, or brown mucus production with coughing. °HOME CARE INSTRUCTIONS  °· Only take over-the-counter or prescription medicines for pain, discomfort, diarrhea, or fever as directed by your caregiver. °· Drink enough water and fluids to keep your urine clear or pale yellow. Sports drinks can provide valuable electrolytes, sugars, and hydration. °· Get plenty of rest and maintain proper nutrition. Soups and broths with crackers or rice are fine. °SEEK IMMEDIATE MEDICAL CARE IF:  °· You have severe headaches, shortness of breath, chest pain, neck pain, or an unusual rash. °· You have uncontrolled vomiting,  diarrhea, or you are unable to keep down fluids. °· You or your child has an oral temperature above 102° F (38.9° C), not controlled by medicine. °· Your baby is older than 3 months with a rectal temperature of 102° F (38.9° C) or higher. °· Your baby is 3 months old or younger with a rectal temperature of 100.4° F (38° C) or higher. °MAKE SURE YOU:  °· Understand these instructions. °· Will watch your condition. °· Will get help right away if you are not doing well or get worse. °  °This information is not intended to replace advice given to you by your health care provider. Make sure you discuss any questions you have with your health care provider. °  °Document Released: 09/12/2005 Document Revised: 02/25/2012 Document Reviewed: 05/11/2015 °Elsevier Interactive Patient Education ©2016 [employer] ° °

## 2016-03-25 NOTE — ED Provider Notes (Signed)
[facility]  ____________________________________________  Time seen: Approximately 11:51 AM  I have reviewed the triage vital signs and the nursing notes.   HISTORY  Chief Complaint Fever; Cough; and Generalized Body Aches  HPI Brooke Cross is a 55 y.o. female presents for complaints of cough, congestion and body aches x 2 days. States symptom onset Friday night. Reports fever 101 degrees orally yesterday at home. Reports took advil yesterday, none today. Reports continues to drink plenty of fluids and eat well. Denies home sick contacts. Reports frequently come in contact with sick contacts when traveling for work. Patient requests a work note.  Patient also reports that she has borderline asthma and states that she usually has a home albuterol inhaler. Patient states that her home albuterol inhaler has ran out and request a prescription for another.  Denies chest pain, shortness of breath, abdominal pain, dysuria, neck pain, back pain, extremity pain, extremity swelling or rash.  No LMP recorded. Patient is postmenopausal.  PCP: [facility]    Past Medical History  Diagnosis Date  . Arthritis   Borderline asthma per patient  There are no active problems to display for this patient.   Past Surgical History  Procedure Laterality Date  . Cesarean section    . Kidney surgery      Current Outpatient Rx  Name  Route  Sig  Dispense  Refill  . albuterol (PROVENTIL HFA;VENTOLIN HFA) 108 (90 Base) MCG/ACT inhaler   Inhalation   Inhale into the lungs every 6 (six) hours as needed for wheezing or shortness of breath.         . diclofenac (VOLTAREN) 75 MG EC tablet   Oral   Take 75 mg by mouth 2 (two) times daily.                    Marland Kitchen. albuterol (PROVENTIL) (2.5 MG/3ML) 0.083% nebulizer solution   Nebulization   Take 2.5 mg by nebulization every 6 (six) hours as needed for wheezing or shortness of breath.           Allergies Ciprofloxacin; Contrast  media; Demerol; Penicillins; Shellfish allergy; and Sulfa antibiotics  No family history on file.  Social History Social History  Substance Use Topics  . Smoking status: Former Games developermoker  . Smokeless tobacco: None  . Alcohol Use: Yes    Review of Systems Constitutional: As above Eyes: No visual changes. ENT: Positive sore throat, nasal congestion, cough. Cardiovascular: Denies chest pain. Respiratory: Denies shortness of breath. Gastrointestinal: No abdominal pain.  No nausea, no vomiting.  No diarrhea.  No constipation. Genitourinary: Negative for dysuria. Musculoskeletal: Negative for back pain. Skin: Negative for rash. Neurological: Negative for headaches, focal weakness or numbness.  10-point ROS otherwise negative.  ____________________________________________   PHYSICAL EXAM:  VITAL SIGNS: ED Triage Vitals  Enc Vitals Group     BP 03/25/16 1055 131/80 mmHg     Pulse Rate 03/25/16 1055 65     Resp 03/25/16 1055 16     Temp 03/25/16 1055 99 F (37.2 C)     Temp Source 03/25/16 1055 Oral     SpO2 03/25/16 1055 100 %     Weight 03/25/16 1055 180 lb (81.647 kg)     Height 03/25/16 1055 6' (1.829 m)     Head Cir --      Peak Flow --      Pain Score 03/25/16 1105 5     Pain Loc --  Pain Edu? --      Excl. in GC? --   Constitutional: Alert and oriented. Well appearing and in no acute distress. Eyes: Conjunctivae are normal. PERRL. EOMI. Head: Atraumatic. No sinus tenderness to palpation. No swelling. No erythema.  Ears: no erythema, normal TMs bilaterally.   Nose:Nasal congestion with clear rhinorrhea  Mouth/Throat: Mucous membranes are moist. Mild pharyngeal erythema. No tonsillar swelling or exudate.  Neck: No stridor.  No cervical spine tenderness to palpation. Hematological/Lymphatic/Immunilogical: No cervical lymphadenopathy. Cardiovascular: Normal rate, regular rhythm. Grossly normal heart sounds.  Good peripheral circulation. Respiratory: Normal  respiratory effort.  No retractions. Lungs CTAB.No wheezes, rales or rhonchi. Good air movement.  Gastrointestinal: Soft and nontender. Normal Bowel sounds. No CVA tenderness. Musculoskeletal: No lower or upper extremity tenderness nor edema. No cervical, thoracic or lumbar tenderness to palpation. Neurologic:  Normal speech and language. No gross focal neurologic deficits are appreciated. No gait instability. Skin:  Skin is warm, dry and intact. No rash noted. Psychiatric: Mood and affect are normal. Speech and behavior are normal.  ____________________________________________   LABS (all labs ordered are listed, but only abnormal results are displayed)  Labs Reviewed  RAPID STREP SCREEN (NOT AT [facility])  RAPID INFLUENZA A&B ANTIGENS ([facility] ONLY)  CULTURE, GROUP A STREP [facility])     INITIAL IMPRESSION / ASSESSMENT AND PLAN / ED COURSE  Pertinent labs & imaging results that were available during my care of the patient were reviewed by me and considered in my medical decision making (see chart for details).  Very well-appearing patient. No acute distress. Presents for the complaints of 3 days of runny nose, nasal congestion, sore throat, cough and reported fever and bodyaches. Lungs clear throughout. Abdomen soft and nontender. Moist mucous membranes. Suspect viral upper respiratory infection. Will treat supportively and symptomatically. Will give Rx for albuterol inhaler. Will treat supportively with when necessary Tessalon Perles and when necessary guaifenesin with codeine as patient reports she has tolerated this all the past.Discussed indication, risks and benefits of medications with patient. Encouraged rest, fluids and PCP follow-up.  Discussed follow up with Primary care physician this week. Discussed follow up and return parameters including no resolution or any worsening concerns. Patient verbalized understanding and agreed to plan.    ____________________________________________   FINAL CLINICAL IMPRESSION(S) / ED DIAGNOSES  Final diagnoses:  Viral respiratory illness      Note: This dictation was prepared with Dragon dictation along with smaller phrase technology. Any transcriptional errors that result from this process are unintentional.    Renford Dills, NP 03/27/16 [phone]

## 2016-03-25 NOTE — ED Notes (Addendum)
Patient c/o does a lot of travel for work. Pt. stated x 3 days fever of 101 / cough / sore throat / and body ache. Per pt. Would like a note for work.

## 2016-03-27 LAB — CULTURE, GROUP A STREP (THRC)

## 2021-08-06 ENCOUNTER — Encounter: Payer: Self-pay | Admitting: Emergency Medicine

## 2021-08-06 ENCOUNTER — Ambulatory Visit (INDEPENDENT_AMBULATORY_CARE_PROVIDER_SITE_OTHER): Payer: BC Managed Care – PPO

## 2021-08-06 ENCOUNTER — Other Ambulatory Visit: Payer: Self-pay

## 2021-08-06 ENCOUNTER — Ambulatory Visit
Admission: EM | Admit: 2021-08-06 | Discharge: 2021-08-06 | Disposition: A | Discharge status: Home / Self Care | Payer: BC Managed Care – PPO | Attending: Internal Medicine | Admitting: Internal Medicine

## 2021-08-06 DIAGNOSIS — U071 COVID-19: Secondary | ICD-10-CM | POA: Diagnosis not present

## 2021-08-06 DIAGNOSIS — R059 Cough, unspecified: Secondary | ICD-10-CM | POA: Diagnosis not present

## 2021-08-06 MED ORDER — MOLNUPIRAVIR EUA 200MG CAPSULE: 4.0000 | ORAL_CAPSULE | Freq: Two times a day (BID) | ORAL | 0 refills | Status: AC

## 2021-08-06 MED ORDER — LEVOFLOXACIN 500 MG PO TABS: 500.0000 mg | ORAL_TABLET | Freq: Every day | ORAL | 0 refills | Status: AC

## 2021-08-06 MED ORDER — PREDNISONE 50 MG PO TABS: ORAL_TABLET | ORAL | 0 refills | Status: AC

## 2021-08-06 MED ORDER — HYDROCODONE BIT-HOMATROP MBR 5-1.5 MG/5ML PO SOLN: 5.0000 mL | Freq: Every evening | ORAL | 0 refills | Status: DC | PRN

## 2021-08-06 MED ORDER — HYDROCODONE BIT-HOMATROP MBR 5-1.5 MG/5ML PO SOLN: 5.0000 mL | Freq: Every evening | ORAL | 0 refills | Status: AC | PRN

## 2021-08-06 NOTE — ED Triage Notes (Signed)
Patient c/o diarrhea, fever, cough, headache, and nasal congestion that started yesterday.  Patient took home covid test today and was positive.  Patient states her cough has been going on for 6 weeks.

## 2021-08-06 NOTE — Discharge Instructions (Addendum)
You need to stay quarantined for 5 days from onset of symptoms, and then wear a mask the other 5 days in public or around family. You may take the following supplements to help your immune system be stronger to fight this viral infection Zinc 50 mg which helps decrease the virus load in your body. Take Melatonin 6-10 mg at bed time which also helps support your immune system.  Also make sure to take Vit D 5,000 IU per day with a fatty meal and Vit C 5000 mg a day until you are completely better. To prevent viral illnesses your vitamin D should be between 60-80. Stay on Vitamin D 2,000  and C  1000 mg the rest of the season.  Don't lay around, keep active and walk as much as you are able to to prevent worsening of your symptoms.  Follow up with your family Dr next week.  If you get short of breath and you are able to check  your oxygen with a pulse oxygen meter, if it gets to 92% or less, you need to go to the hospital to be admitted. If you dont have one, come back here and we will assess you.

## 2021-08-06 NOTE — ED Provider Notes (Signed)
[facility]    CSN: [id number] Arrival date & time: 08/06/21  1215      History   Chief Complaint Chief Complaint  Patient presents with   Cough   Fever    COVID+    HPI Brooke Cross is a 60 y.o. female who presents wit onset cough x 6 weeks, but got worse yesterday with HA, nose congestion, fever, diarrhea. Did a covid test on herself yesterday and was positive. Has never had covid. Has had 4 covid injections. She was seen on 8/8 by her PCP and was placed on Zpack which helped a little, but when done felt her cough got wore. The cough is mostly non productive an on occasion may have some production of yellow mucous.     Past Medical History:  Diagnosis Date   Arthritis     There are no problems to display for this patient.   Past Surgical History:  Procedure Laterality Date   CESAREAN SECTION     KIDNEY SURGERY      OB History   No obstetric history on file.      Home Medications    Prior to Admission medications   Medication Sig Start Date End Date Taking? Authorizing Provider  albuterol (PROVENTIL HFA;VENTOLIN HFA) 108 (90 Base) MCG/ACT inhaler Inhale 2 puffs into the lungs every 4 (four) hours as needed. 03/25/16  Yes Renford Dills, NP  levofloxacin (LEVAQUIN) 500 MG tablet Take 1 tablet (500 mg total) by mouth daily. 08/06/21  Yes Rodriguez-Southworth, Nettie Elm, PA-C  molnupiravir EUA 200 mg CAPS Take 4 capsules (800 mg total) by mouth 2 (two) times daily for 5 days. 08/06/21 08/11/21 Yes Rodriguez-Southworth, Nettie Elm, PA-C  predniSONE (DELTASONE) 50 MG tablet One qd x 5 days 08/06/21  Yes Rodriguez-Southworth, Nettie Elm, PA-C  diclofenac (VOLTAREN) 75 MG EC tablet Take 75 mg by mouth 2 (two) times daily.    [provider]  HYDROcodone bit-homatropine (HYCODAN) 5-1.5 MG/5ML syrup Take 5 mLs by mouth at bedtime as needed for cough. 08/06/21   Rodriguez-Southworth, Nettie Elm, PA-C    Family History History reviewed. No pertinent family  history.  Social History Social History   Tobacco Use   Smoking status: Former   Smokeless tobacco: Never  Building services engineer Use: Never used  Substance Use Topics   Alcohol use: Yes   Drug use: No     Allergies   Ciprofloxacin, Contrast media [iodinated diagnostic agents], Demerol [meperidine], Penicillins, Shellfish allergy, and Sulfa antibiotics   Review of Systems Review of Systems + cough, SOB, wheezing, fever of 100, nose congestion, HA, body aches, fatigue. The rest is neg.   Physical Exam Triage Vital Signs ED Triage Vitals  Enc Vitals Group     BP 08/06/21 1314 (!) 150/90     Pulse Rate 08/06/21 1314 75     Resp 08/06/21 1314 14     Temp 08/06/21 1314 99.5 F (37.5 C)     Temp Source 08/06/21 1314 Oral     SpO2 08/06/21 1314 100 %     Weight 08/06/21 1313 174 lb (78.9 kg)     Height 08/06/21 1313 5' 11.5" (1.816 m)     Head Circumference --      Peak Flow --      Pain Score 08/06/21 1312 0     Pain Loc --      Pain Edu? --      Excl. in GC? --    No data found.  Updated Vital Signs BP (!) 150/90 (BP Location: Left Arm)   Pulse 75   Temp 99.5 F (37.5 C) (Oral)   Resp 14   Ht 5' 11.5" (1.816 m)   Wt 174 lb (78.9 kg)   SpO2 100%   BMI 23.93 kg/m   Visual Acuity Right Eye Distance:   Left Eye Distance:   Bilateral Distance:    Right Eye Near:   Left Eye Near:    Bilateral Near:     Physical Exam Physical Exam Vitals signs and nursing note reviewed.  Constitutional:      General: She is not in acute distress.    Appearance: Normal appearance. She is not ill-appearing, toxic-appearing or diaphoretic.  HENT:     Head: Normocephalic.     Right Ear: Tympanic membrane, ear canal and external ear normal.     Left Ear: Tympanic membrane, ear canal and external ear normal.     Nose: Nose normal.     Mouth/Throat:     Mouth: Mucous membranes are moist.  Eyes:     General: No scleral icterus.       Right eye: No discharge.        Left  eye: No discharge.     Conjunctiva/sclera: Conjunctivae normal.  Neck:     Musculoskeletal: Neck supple. No neck rigidity.  Cardiovascular:     Rate and Rhythm: Normal rate and regular rhythm.     Heart sounds: No murmur.  Pulmonary:     Effort: Pulmonary effort is normal.     Breath sounds: with mild wheezing on R upper lung. The rest is clear  Musculoskeletal: Normal range of motion.  Lymphadenopathy:     Cervical: No cervical adenopathy.  Skin:    General: Skin is warm and dry.     Coloration: Skin is not jaundiced.     Findings: No rash.  Neurological:     Mental Status: She is alert and oriented to person, place, and time.     Gait: Gait normal.  Psychiatric:        Mood and Affect: Mood normal.        Behavior: Behavior normal.        Thought Content: Thought content normal.        Judgment: Judgment normal.    UC Treatments / Results  Labs (all labs ordered are listed, but only abnormal results are displayed) Labs Reviewed - No data to display  EKG   Radiology DG Chest 2 View  Result Date: 08/06/2021 CLINICAL DATA:  Cough for 6 weeks. EXAM: CHEST - 2 VIEW COMPARISON:  01/15/2016 FINDINGS: The heart size and mediastinal contours are within normal limits. Both lungs are clear. The visualized skeletal structures are unremarkable. IMPRESSION: No active cardiopulmonary disease. Electronically Signed   By: Danae Orleans M.D.   On: 08/06/2021 13:37    Procedures Procedures (including critical care time)  Medications Ordered in UC Medications - No data to display  Initial Impression / Assessment and Plan / UC Course  I have reviewed the triage vital signs and the nursing notes. Pertinent  imaging results that were available during my care of the patient were reviewed by me and considered in my medical decision making (see chart for details). Has covid infection and possibly bacterial bronchitis.  I placed her on Levaquin since she tolerates this fine, Prednisone,  Molnupiravir, and hycodan syrup as noted. See instructions.     Final Clinical Impressions(s) / UC Diagnoses   Final  diagnoses:  COVID-19 virus infection     Discharge Instructions      You need to stay quarantined for 5 days from onset of symptoms, and then wear a mask the other 5 days in public or around family. You may take the following supplements to help your immune system be stronger to fight this viral infection Zinc 50 mg which helps decrease the virus load in your body. Take Melatonin 6-10 mg at bed time which also helps support your immune system.  Also make sure to take Vit D 5,000 IU per day with a fatty meal and Vit C 5000 mg a day until you are completely better. To prevent viral illnesses your vitamin D should be between 60-80. Stay on Vitamin D 2,000  and C  1000 mg the rest of the season.  Don't lay around, keep active and walk as much as you are able to to prevent worsening of your symptoms.  Follow up with your family Dr next week.  If you get short of breath and you are able to check  your oxygen with a pulse oxygen meter, if it gets to 92% or less, you need to go to the hospital to be admitted. If you dont have one, come back here and we will assess you.       ED Prescriptions     Medication Sig Dispense Auth. Provider   levofloxacin (LEVAQUIN) 500 MG tablet Take 1 tablet (500 mg total) by mouth daily. 7 tablet Rodriguez-Southworth, Nettie Elm, PA-C   molnupiravir EUA 200 mg CAPS Take 4 capsules (800 mg total) by mouth 2 (two) times daily for 5 days. 40 capsule Rodriguez-Southworth, Nettie Elm, PA-C   predniSONE (DELTASONE) 50 MG tablet One qd x 5 days 5 tablet Rodriguez-Southworth, Brodi Kari, PA-C   HYDROcodone bit-homatropine (HYCODAN) 5-1.5 MG/5ML syrup  (Status: Discontinued) Take 5 mLs by mouth at bedtime as needed for cough. 120 mL Rodriguez-Southworth, Rudell Marlowe, PA-C   HYDROcodone bit-homatropine (HYCODAN) 5-1.5 MG/5ML syrup Take 5 mLs by mouth at bedtime as needed  for cough. 120 mL Rodriguez-Southworth, Nettie Elm, PA-C      PDMP not reviewed this encounter.   Garey Ham, PA-C 08/06/21 1355

## 2022-05-01 ENCOUNTER — Ambulatory Visit
Admission: EM | Admit: 2022-05-01 | Discharge: 2022-05-01 | Disposition: A | Discharge status: Home / Self Care | Payer: BC Managed Care – PPO | Attending: Emergency Medicine | Admitting: Emergency Medicine

## 2022-05-01 ENCOUNTER — Encounter: Payer: Self-pay | Admitting: Emergency Medicine

## 2022-05-01 ENCOUNTER — Other Ambulatory Visit: Payer: Self-pay

## 2022-05-01 DIAGNOSIS — B3731 Acute candidiasis of vulva and vagina: Secondary | ICD-10-CM | POA: Diagnosis present

## 2022-05-01 DIAGNOSIS — N760 Acute vaginitis: Secondary | ICD-10-CM

## 2022-05-01 DIAGNOSIS — B9689 Other specified bacterial agents as the cause of diseases classified elsewhere: Secondary | ICD-10-CM | POA: Diagnosis present

## 2022-05-01 DIAGNOSIS — R300 Dysuria: Secondary | ICD-10-CM | POA: Diagnosis present

## 2022-05-01 DIAGNOSIS — N3000 Acute cystitis without hematuria: Secondary | ICD-10-CM

## 2022-05-01 LAB — URINALYSIS, ROUTINE W REFLEX MICROSCOPIC
Bilirubin Urine: NEGATIVE
Glucose, UA: NEGATIVE mg/dL
Hgb urine dipstick: NEGATIVE
Ketones, ur: NEGATIVE mg/dL
Leukocytes,Ua: NEGATIVE
Nitrite: NEGATIVE
Protein, ur: NEGATIVE mg/dL
Specific Gravity, Urine: 1.01 (ref 1.005–1.030)
pH: 5.5 (ref 5.0–8.0)

## 2022-05-01 LAB — WET PREP, GENITAL
Sperm: NONE SEEN
Trich, Wet Prep: NONE SEEN
WBC, Wet Prep HPF POC: <10 — AB (ref <10)

## 2022-05-01 MED ORDER — FLUCONAZOLE 200 MG PO TABS: 200.0000 mg | ORAL_TABLET | Freq: Every day | ORAL | 1 refills | Status: AC

## 2022-05-01 MED ORDER — METRONIDAZOLE 500 MG PO TABS: 500.0000 mg | ORAL_TABLET | Freq: Two times a day (BID) | ORAL | 0 refills | Status: AC

## 2022-05-01 NOTE — Discharge Instructions (Signed)
Take the Flagyl (metronidazole) 500 mg twice daily for treatment of your bacterial vaginosis. ? ?Avoid alcohol while on the metronidazole as taken together will cause of vomiting. ? ?Bacterial vaginosis is often caused by a imbalance of bacteria in your vaginal vault.  This is sometimes a result of using tampons or hormonal fluctuations during her menstrual cycle. ? ?You if your symptoms are recurrent you can try using a boric acid suppository twice weekly to help maintain the acid-base balance in your vagina vault which could prevent further infection. ? ?You can also try vaginal probiotics to help return normal bacterial balance.  ? ?Take one Diflucan tablet now and repeat in 1 week. ?

## 2022-05-01 NOTE — ED Provider Notes (Signed)
?[facility] ? ? ? ?CSN: [id number] ?Arrival date & time: 05/01/22  1857 ? ? ?  ? ?History   ?Chief Complaint ?Chief Complaint  ?Patient presents with  ? Recurrent UTI  ? ? ?HPI ?Brooke Cross is a 61 y.o. female.  ? ?HPI ? ?61 year old female here for evaluation of urinary complaints. ? ?Patient reports that 3 days ago she developed painful urination with urinary frequency and bladder pressure.  She is also is complaining of lower abdominal pain and low back pain.  Yesterday she states she felt feverish and had chills but did not measure a fever.  Prior to onset of symptoms she had some diarrhea but that is resolved.  She denies any nausea, vomiting, or hematuria.  She does take Macrobid daily for the prevention of UTIs and when her symptoms started she doubled up and started taking it twice daily but she has not seen any improvement of symptoms. ? ?Past Medical History:  ?Diagnosis Date  ? Arthritis   ? ? ?There are no problems to display for this patient. ? ? ?Past Surgical History:  ?Procedure Laterality Date  ? CESAREAN SECTION    ? KIDNEY SURGERY    ? ? ?OB History   ?No obstetric history on file. ?  ? ? ? ?Home Medications   ? ?Prior to Admission medications   ?Medication Sig Start Date End Date Taking? Authorizing Provider  ?diclofenac (VOLTAREN) 75 MG EC tablet Take 75 mg by mouth 2 (two) times daily.   Yes [provider]  ?fluconazole (DIFLUCAN) 200 MG tablet Take 1 tablet (200 mg total) by mouth daily for 2 doses. 05/01/22 05/03/22 Yes Becky Augusta, NP  ?metroNIDAZOLE (FLAGYL) 500 MG tablet Take 1 tablet (500 mg total) by mouth 2 (two) times daily. 05/01/22  Yes Becky Augusta, NP  ?albuterol (PROVENTIL HFA;VENTOLIN HFA) 108 (90 Base) MCG/ACT inhaler Inhale 2 puffs into the lungs every 4 (four) hours as needed. 03/25/16   Renford Dills, NP  ?HYDROcodone bit-homatropine (HYCODAN) 5-1.5 MG/5ML syrup Take 5 mLs by mouth at bedtime as needed for cough. ?Patient not taking: Reported on  05/01/2022 08/06/21   Rodriguez-Southworth, Nettie Elm, PA-C  ?levofloxacin (LEVAQUIN) 500 MG tablet Take 1 tablet (500 mg total) by mouth daily. ?Patient not taking: Reported on 05/01/2022 08/06/21   Rodriguez-Southworth, Nettie Elm, PA-C  ?nitrofurantoin, macrocrystal-monohydrate, (MACROBID) 100 MG capsule Take 100 mg by mouth daily. 03/12/22   [provider]  ?predniSONE (DELTASONE) 50 MG tablet One qd x 5 days ?Patient not taking: Reported on 05/01/2022 08/06/21   Rodriguez-Southworth, Nettie Elm, PA-C  ? ? ?Family History ?Family History  ?Problem Relation Age of Onset  ? Healthy Mother   ? ? ?Social History ?Social History  ? ?Tobacco Use  ? Smoking status: Former  ? Smokeless tobacco: Never  ?Vaping Use  ? Vaping Use: Never used  ?Substance Use Topics  ? Alcohol use: Yes  ? Drug use: No  ? ? ? ?Allergies   ?Ciprofloxacin, Contrast media [iodinated contrast media], Demerol [meperidine], Penicillins, Shellfish allergy, and Sulfa antibiotics ? ? ?Review of Systems ?Review of Systems  ?Constitutional:  Positive for chills and fever.  ?Gastrointestinal:  Positive for abdominal pain.  ?Genitourinary:  Positive for dysuria, frequency and urgency. Negative for hematuria, vaginal discharge and vaginal pain.  ?Musculoskeletal:  Positive for back pain.  ?Hematological: Negative.   ?Psychiatric/Behavioral: Negative.    ? ? ?Physical Exam ?Triage Vital Signs ?ED Triage Vitals  ?Enc Vitals Group  ?   BP  05/01/22 2014 (!) 145/90  ?   Pulse Rate 05/01/22 2014 85  ?   Resp 05/01/22 2014 20  ?   Temp 05/01/22 2014 98.7 ?F (37.1 ?C)  ?   Temp Source 05/01/22 2014 Oral  ?   SpO2 05/01/22 2014 96 %  ?   Weight --   ?   Height --   ?   Head Circumference --   ?   Peak Flow --   ?   Pain Score 05/01/22 2010 5  ?   Pain Loc --   ?   Pain Edu? --   ?   Excl. in GC? --   ? ?No data found. ? ?Updated Vital Signs ?BP (!) 145/90 (BP Location: Right Arm)   Pulse 85   Temp 98.7 ?F (37.1 ?C) (Oral)   Resp 20   SpO2 96%  ? ?Visual Acuity ?Right  Eye Distance:   ?Left Eye Distance:   ?Bilateral Distance:   ? ?Right Eye Near:   ?Left Eye Near:    ?Bilateral Near:    ? ?Physical Exam ?Vitals and nursing note reviewed.  ?Constitutional:   ?   Appearance: Normal appearance. She is not ill-appearing.  ?HENT:  ?   Head: Normocephalic and atraumatic.  ?Cardiovascular:  ?   Rate and Rhythm: Normal rate and regular rhythm.  ?   Pulses: Normal pulses.  ?   Heart sounds: Normal heart sounds. No murmur heard. ?  No friction rub. No gallop.  ?Pulmonary:  ?   Effort: Pulmonary effort is normal.  ?   Breath sounds: Normal breath sounds. No wheezing, rhonchi or rales.  ?Abdominal:  ?   General: Abdomen is flat.  ?   Palpations: Abdomen is soft.  ?   Tenderness: There is abdominal tenderness. There is no right CVA tenderness, left CVA tenderness, guarding or rebound.  ?Skin: ?   General: Skin is warm and dry.  ?   Capillary Refill: Capillary refill takes less than 2 seconds.  ?   Findings: No erythema or rash.  ?Neurological:  ?   General: No focal deficit present.  ?   Mental Status: She is alert and oriented to person, place, and time.  ?Psychiatric:     ?   Mood and Affect: Mood normal.     ?   Behavior: Behavior normal.     ?   Thought Content: Thought content normal.     ?   Judgment: Judgment normal.  ? ? ? ?UC Treatments / Results  ?Labs ?(all labs ordered are listed, but only abnormal results are displayed) ?Labs Reviewed  ?WET PREP, GENITAL - Abnormal; Notable for the following components:  ?    Result Value  ? Yeast Wet Prep HPF POC PRESENT (*)   ? Clue Cells Wet Prep HPF POC PRESENT (*)   ? WBC, Wet Prep HPF POC <10 (*)   ? All other components within normal limits  ?URINALYSIS, ROUTINE W REFLEX MICROSCOPIC  ?POCT URINALYSIS DIPSTICK, ED / UC  ? ? ?EKG ? ? ?Radiology ?No results found. ? ?Procedures ?Procedures (including critical care time) ? ?Medications Ordered in UC ?Medications - No data to display ? ?Initial Impression / Assessment and Plan / UC Course  ?I  have reviewed the triage vital signs and the nursing notes. ? ?Pertinent labs & imaging results that were available during my care of the patient were reviewed by me and considered in my medical decision making (see chart  for details). ? ?Patient is a pleasant, nontoxic-appearing 61 year old female here for evaluation of urinary symptoms as outlined HPI above.  Her physical exam reveals a benign cardiopulmonary exam with S1-S2 heart sounds with regular rate and rhythm and lung sounds that are clear to auscultation all fields.  No CVA tenderness on exam.  Abdomen is soft with mild suprapubic tenderness.  No guarding or rebound.  Urinalysis was collected at triage and is pending. ? ?Urinalysis is unremarkable. ? ?I will order a vaginal wet prep to look for the presence of bacterial vaginosis or yeast which may explain the symptoms the patient is having.  She does indicate that she and her husband had intercourse 2 days before her symptoms began.  Given that she is on prophylactic antibiotics it also increases her risk of developing a vaginal yeast infection. ? ?Vaginal wet prep is positive for yeast and clue cells. ? ?I will discharge patient home on metronidazole and Diflucan. ? ? ?Final Clinical Impressions(s) / UC Diagnoses  ? ?Final diagnoses:  ?Acute cystitis without hematuria  ?Dysuria  ?BV (bacterial vaginosis)  ?Vaginal yeast infection  ? ? ? ?Discharge Instructions   ? ?  ?Take the Flagyl (metronidazole) 500 mg twice daily for treatment of your bacterial vaginosis. ? ?Avoid alcohol while on the metronidazole as taken together will cause of vomiting. ? ?Bacterial vaginosis is often caused by a imbalance of bacteria in your vaginal vault.  This is sometimes a result of using tampons or hormonal fluctuations during her menstrual cycle. ? ?You if your symptoms are recurrent you can try using a boric acid suppository twice weekly to help maintain the acid-base balance in your vagina vault which could prevent  further infection. ? ?You can also try vaginal probiotics to help return normal bacterial balance.  ? ?Take one Diflucan tablet now and repeat in 1 week. ? ? ? ? ?ED Prescriptions   ? ? Medication Sig Dispense Auth. Pro

## 2022-05-01 NOTE — ED Triage Notes (Signed)
Patient has a history of uti and had kidney surgery in her past.  Takes Macrobid once a day to prevent break through infection.  Woke Saturday morning with a uti, doubled up on Macrobid, but it has not helped .  Patient has frequency, pressure, pain with urination.  Patient reports she has diarrhea that started at the same time frame, not typical with her history.  Covid test negative ?

## 2023-04-07 IMAGING — CR DG CHEST 2V
2 series · 3 of 3 positions shown · non-contrast
Comparison: 01/15/2016

CLINICAL DATA: Cough for 6 weeks.

EXAM:
CHEST - 2 VIEW

[Series 1: chest pa · 0.14mm/px · 2 of 2 slices shown]
[im 1/2]
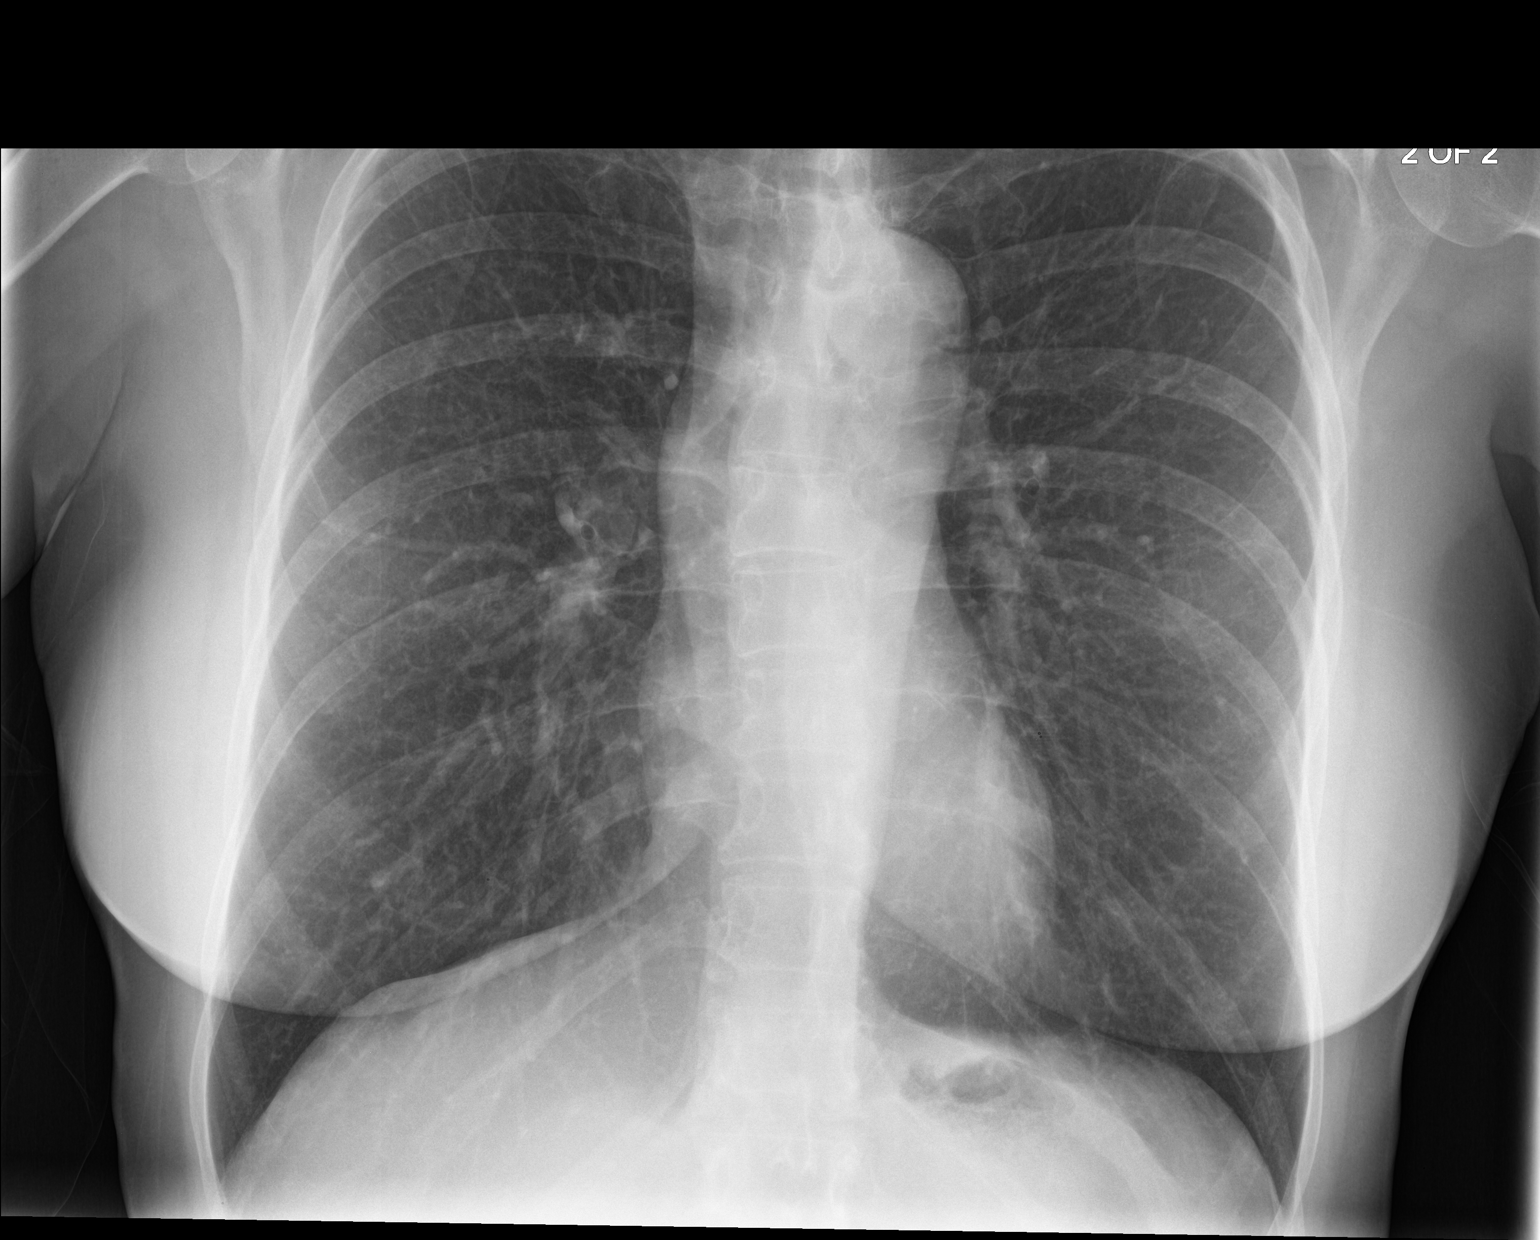
[im 2/2]
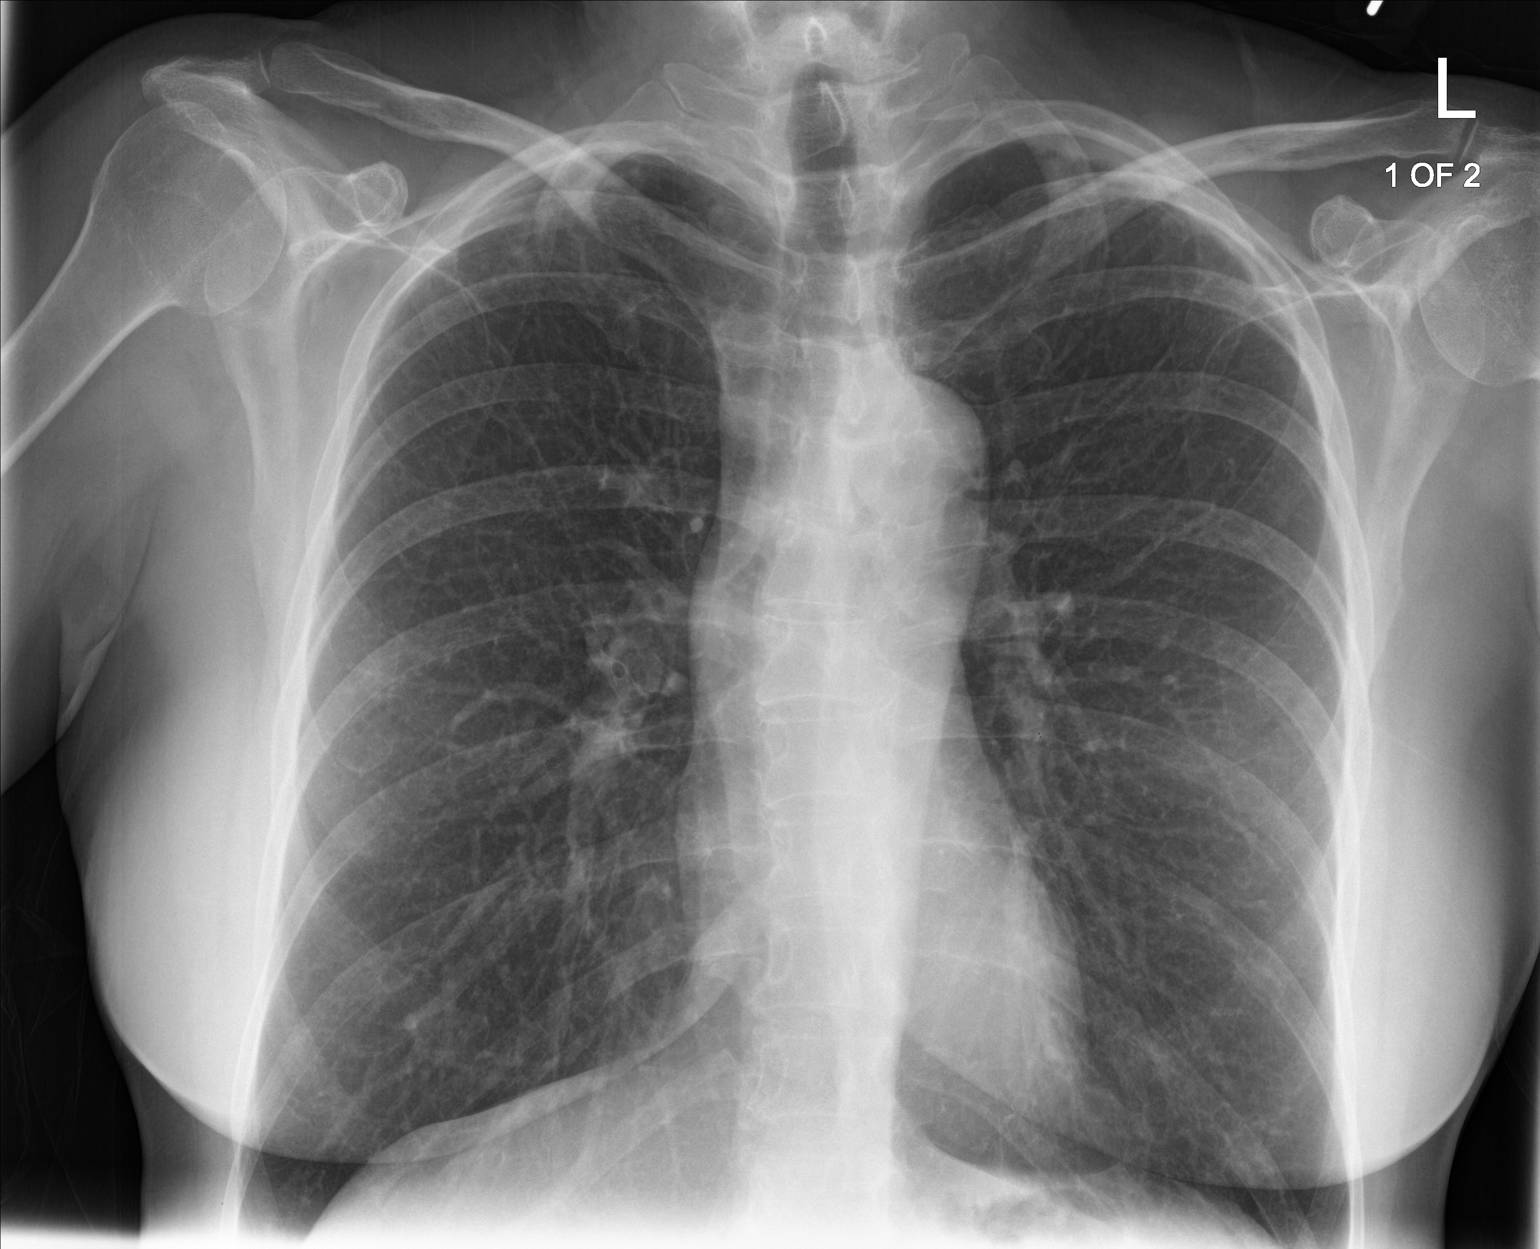

[chest lat]
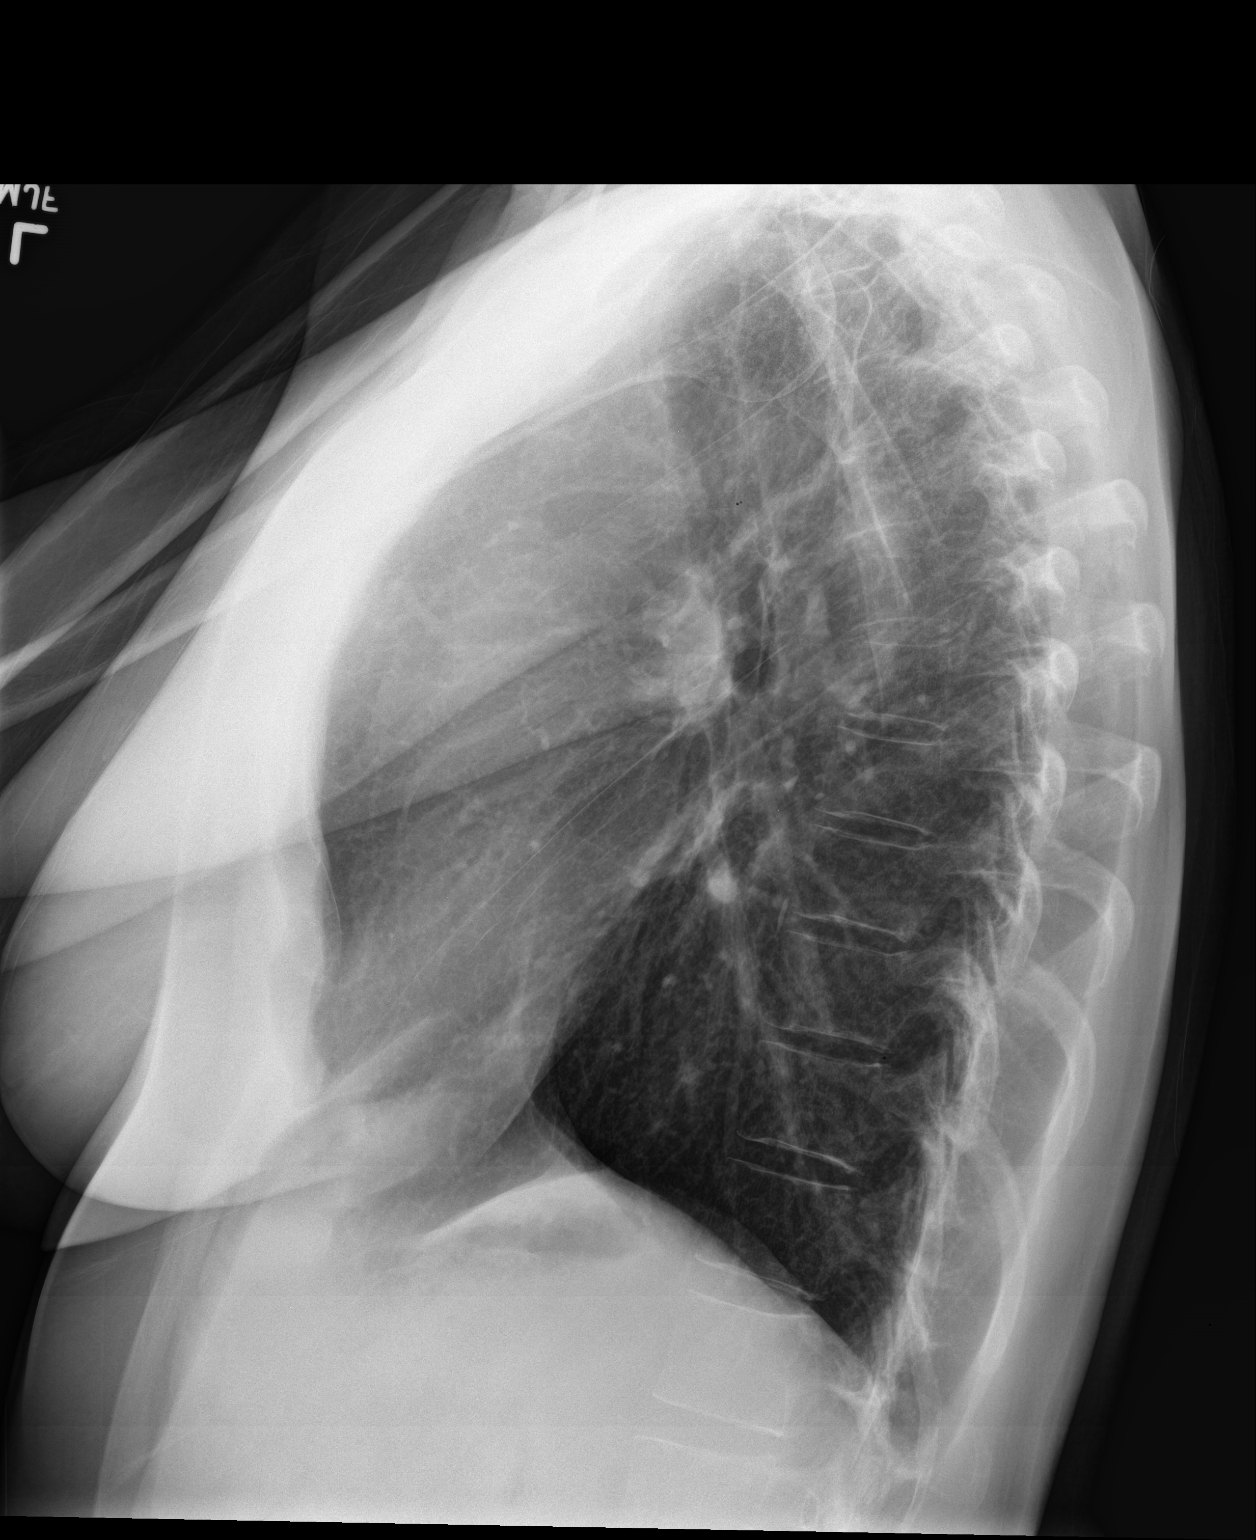

[3 of 3 positions shown; findings below may reference images not displayed]

FINDINGS: The heart size and mediastinal contours are within normal limits.
Both lungs are clear. The visualized skeletal structures are
unremarkable.
IMPRESSION: No active cardiopulmonary disease.
